# Patient Record
Sex: Male | Born: 1985 | Race: White | Hispanic: No | Marital: Single | Smoking: Former smoker
Health system: Southern US, Community
[De-identification: ages and names within clinical notes are randomized; demographics above are authoritative.]

## PROBLEM LIST (undated history)

## (undated) DIAGNOSIS — I444 Left anterior fascicular block: Secondary | ICD-10-CM

## (undated) DIAGNOSIS — G2581 Restless legs syndrome: Secondary | ICD-10-CM

## (undated) DIAGNOSIS — K219 Gastro-esophageal reflux disease without esophagitis: Secondary | ICD-10-CM

## (undated) DIAGNOSIS — R0602 Shortness of breath: Secondary | ICD-10-CM

## (undated) DIAGNOSIS — G4710 Hypersomnia, unspecified: Secondary | ICD-10-CM

## (undated) DIAGNOSIS — S36039A Unspecified laceration of spleen, initial encounter: Secondary | ICD-10-CM

## (undated) HISTORY — DX: Unspecified laceration of spleen, initial encounter: S36.039A

## (undated) HISTORY — DX: Left anterior fascicular block: I44.4

## (undated) HISTORY — PX: NO PAST SURGERIES: SHX2092

---
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
## 2004-09-23 ENCOUNTER — Emergency Department (HOSPITAL_COMMUNITY): Admission: EM | Admit: 2004-09-23 | Discharge: 2004-09-23 | Payer: Self-pay | Admitting: Emergency Medicine

## 2005-06-01 ENCOUNTER — Inpatient Hospital Stay (HOSPITAL_COMMUNITY): Admission: EM | Admit: 2005-06-01 | Discharge: 2005-06-06 | Payer: Self-pay | Admitting: Emergency Medicine
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
## 2019-06-19 NOTE — Progress Notes (Signed)
Cardiology Office Note:    Date:  06/23/2019   ID:  Dustin FredricksonJustin Bradley, DOB 1985-11-27, MRN [id number]  PCP:  [facility]  Cardiologist:  Norman HerrlichBrian Fidelia Cathers, MD   Referring MD: No ref. provider found  ASSESSMENT:    1. LAFB (left anterior fascicular block)   2. Varicose veins of leg with swelling, bilateral   3. Pain of lower extremity, unspecified laterality    PLAN:    In order of problems listed above:  1. I reviewed with him his EKG finding which is somewhat unusual in his age group but not specific and is not at risk of pacemaker therapy in the intermediate future.  I did ask him undergo an echocardiogram to screen for cardiomyopathy and structural heart disease. 2. He will have a lower extremity venous duplex done with complaints of pain and swelling and if he has evidence of venous insufficiency would benefit from lower extremity compressive hose.  Next appointment in 6 weeks to follow-up from this visit   Medication Adjustments/Labs and Tests Ordered: Current medicines are reviewed at length with the patient today.  Concerns regarding medicines are outlined above.  Orders Placed This Encounter  Procedures  . EKG 12-Lead  . VAS US LOWER EXTREMITY VENOUS (DVT)   No orders of the defined types were placed in this encounter.    Chief Complaint  Patient presents with  . Abnormal ECG  My legs swell  History of Present Illness:    Dustin FredricksonJustin Brannigan is a 33 y.o. male who is being seen today for the evaluation of an abnormal EKG [facility].  Initially there is discussion of stimulant therapy for ADHD EKG was performed and showed a pattern of incomplete right bundle branch block left anterior hemiblock QRS duration 114 ms 04/30/2019.  He has advised cardiology consultation and there was delay being seen in their organization.  EKG in my office today shows left anterior hemiblock QRS duration 104 ms.  He has no known history of heart disease or  systemic illness.  His risk factors are noteworthy for previous smoking.  In the interim he did heavy outdoor work and is complained of pain in the lower extremities especially in his calves and ankles has had mild dependent edema about the ankle and dorsum of the feet.  He is quite concerned about this.  He is not short of breath no orthopnea palpitation or syncope.  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  Has no known history of congenital or rheumatic heart disease.  Unfortunately is adding salt to his diet and asked him to stop with his dependent edema  Past Medical History:  Diagnosis Date  . GERD (gastroesophageal reflux disease) 06/23/2019  . Hypersomnia 06/23/2019  . Restless leg syndrome 06/23/2019  . Shortness of breath 06/23/2019  . Splenic laceration     Past Surgical History:  Procedure Laterality Date  . NO PAST SURGERIES      Current Medications: Current Meds  Medication Sig  . cetirizine (ZYRTEC) 10 MG tablet Take 10 mg by mouth daily.  Marland Kitchen. ibuprofen (ADVIL) 200 MG tablet Take 200 mg by mouth every 6 (six) hours as needed.  . Multiple Vitamin (MULTIVITAMIN) tablet Take 1 tablet by mouth daily.  Marland Kitchen. omeprazole (PRILOSEC) 20 MG capsule Take 20 mg by mouth daily.     Allergies:   Patient has no known allergies.  Social History   Socioeconomic History  . Marital status: Single    Spouse name: Not on file  . Number of children: Not on file  . Years of education: Not on file  . Highest education level: Not on file  Occupational History  . Not on file  Social Needs  . Financial resource strain: Not on file  . Food insecurity    Worry: Not on file    Inability: Not on file  . Transportation needs    Medical: Not on file    Non-medical: Not on file  Tobacco Use  . Smoking status: Former Smoker    Packs/day: 1.00    Years: 13.00    Pack years: 13.00    Types:  Cigarettes    Quit date: 2020    Years since quitting: 0.8  . Smokeless tobacco: Former Network engineer and Sexual Activity  . Alcohol use: Not Currently  . Drug use: Never  . Sexual activity: Not on file  Lifestyle  . Physical activity    Days per week: Not on file    Minutes per session: Not on file  . Stress: Not on file  Relationships  . Social Herbalist on phone: Not on file    Gets together: Not on file    Attends religious service: Not on file    Active member of club or organization: Not on file    Attends meetings of clubs or organizations: Not on file    Relationship status: Not on file  Other Topics Concern  . Not on file  Social History Narrative  . Not on file     Family History: The patient's family history includes Arrhythmia in his paternal grandfather; Diabetes Mellitus II in his father and mother; Heart attack in his maternal grandmother; Hyperlipidemia in his father and mother; Hypertension in his father and mother.  ROS:   Review of Systems  Constitution: Positive for weight loss.  HENT: Negative.   Eyes: Negative.  Visual disturbance: 19 lbs.  Cardiovascular: Positive for chest pain.  Respiratory: Negative.   Endocrine: Negative.   Hematologic/Lymphatic: Negative.   Skin: Negative.   Musculoskeletal: Positive for back pain, joint pain and myalgias.  Gastrointestinal: Negative.   Genitourinary: Negative.   Neurological: Positive for difficulty with concentration.  Psychiatric/Behavioral: Negative.   Allergic/Immunologic: Negative.    Please see the history of present illness.     All other systems reviewed and are negative.  EKGs/Labs/Other Studies Reviewed:    The following studies were reviewed today:   EKG:  EKG is  ordered today.  The ekg ordered today is personally reviewed and demonstrates sinus rhythm left anterior hemiblock QRS duration 104 ms  Recent Labs: No results found for requested labs within last 8760 hours.   Recent Lipid Panel No results found for: CHOL, TRIG, HDL, CHOLHDL, VLDL, LDLCALC, LDLDIRECT  Physical Exam:    VS:  BP 112/80 (BP Location: Right Arm, Patient Position: Sitting, Cuff Size: Large)   Pulse 78   Ht 6' (1.829 m)   Wt 212 lb 6.4 oz (96.3 kg)   SpO2 98%   BMI 28.81 kg/m     Wt Readings from Last 3 Encounters:  06/23/19 212 lb 6.4 oz (96.3 kg)     GEN:  Well nourished, well developed in no acute distress HEENT: Normal NECK: No JVD; No carotid bruits LYMPHATICS: No lymphadenopathy CARDIAC: RRR, no murmurs, rubs, gallops RESPIRATORY:  Clear to auscultation without rales, wheezing  or rhonchi  ABDOMEN: Soft, non-tender, non-distended MUSCULOSKELETAL: He has a minimal degree of dependent edema about the ankles he has no varicosities no palpable cord in the calves or lower extremities to suggest DVT edema; No deformity  SKIN: Warm and dry NEUROLOGIC:  Alert and oriented x 3 PSYCHIATRIC:  Normal affect     Signed, Norman Herrlich, MD  06/23/2019 9:48 AM    [facility]

## 2019-06-23 ENCOUNTER — Other Ambulatory Visit: Payer: Self-pay

## 2019-06-23 ENCOUNTER — Ambulatory Visit (INDEPENDENT_AMBULATORY_CARE_PROVIDER_SITE_OTHER): Payer: BC Managed Care – PPO | Admitting: Cardiology

## 2019-06-23 VITALS — BP 112/80 | HR 78 | Ht 72.0 in | Wt 212.4 lb

## 2019-06-23 DIAGNOSIS — K219 Gastro-esophageal reflux disease without esophagitis: Secondary | ICD-10-CM | POA: Insufficient documentation

## 2019-06-23 DIAGNOSIS — M79606 Pain in leg, unspecified: Secondary | ICD-10-CM | POA: Diagnosis not present

## 2019-06-23 DIAGNOSIS — I444 Left anterior fascicular block: Secondary | ICD-10-CM | POA: Diagnosis not present

## 2019-06-23 DIAGNOSIS — G4710 Hypersomnia, unspecified: Secondary | ICD-10-CM

## 2019-06-23 DIAGNOSIS — R0602 Shortness of breath: Secondary | ICD-10-CM | POA: Insufficient documentation

## 2019-06-23 DIAGNOSIS — I83893 Varicose veins of bilateral lower extremities with other complications: Secondary | ICD-10-CM | POA: Diagnosis not present

## 2019-06-23 DIAGNOSIS — G2581 Restless legs syndrome: Secondary | ICD-10-CM

## 2019-06-23 HISTORY — DX: Restless legs syndrome: G25.81

## 2019-06-23 HISTORY — DX: Hypersomnia, unspecified: G47.10

## 2019-06-23 HISTORY — DX: Gastro-esophageal reflux disease without esophagitis: K21.9

## 2019-06-23 HISTORY — DX: Shortness of breath: R06.02

## 2019-06-23 NOTE — Patient Instructions (Signed)
Medication Instructions:  Your physician recommends that you continue on your current medications as directed. Please refer to the Current Medication list given to you today.  *If you need a refill on your cardiac medications before your next appointment, please call your pharmacy*  Lab Work: None  If you have labs (blood work) drawn today and your tests are completely normal, you will receive your results only by: Marland Kitchen MyChart Message (if you have MyChart) OR . A paper copy in the mail If you have any lab test that is abnormal or we need to change your treatment, we will call you to review the results.  Testing/Procedures: You had an EKG today.   Your physician has requested that you have a lower extremity venous duplex. This test is an ultrasound of the veins in the legs. It looks at venous blood flow that carries blood from the heart to the legs. Allow one hour for a Lower Venous exam. There are no restrictions or special instructions.   Follow-Up: At [facility], you and your health needs are our priority.  As part of our continuing mission to provide you with exceptional heart care, we have created designated Provider Care Teams.  These Care Teams include your primary Cardiologist (physician) and Advanced Practice Providers (APPs -  Physician Assistants and Nurse Practitioners) who all work together to provide you with the care you need, when you need it.  Your next appointment:   6 weeks  The format for your next appointment:   In Person  Provider:   Shirlee More, MD

## 2019-06-24 ENCOUNTER — Ambulatory Visit: Payer: Self-pay

## 2019-06-24 ENCOUNTER — Encounter: Payer: Self-pay | Admitting: Orthopaedic Surgery

## 2019-06-24 ENCOUNTER — Ambulatory Visit (INDEPENDENT_AMBULATORY_CARE_PROVIDER_SITE_OTHER): Payer: BC Managed Care – PPO | Admitting: Orthopaedic Surgery

## 2019-06-24 DIAGNOSIS — M7662 Achilles tendinitis, left leg: Secondary | ICD-10-CM | POA: Diagnosis not present

## 2019-06-24 DIAGNOSIS — M7661 Achilles tendinitis, right leg: Secondary | ICD-10-CM

## 2019-06-24 MED ORDER — PREDNISONE 10 MG (21) PO TBPK: ORAL_TABLET | ORAL | 0 refills | Status: DC

## 2019-06-24 MED ORDER — MELOXICAM 7.5 MG PO TABS: ORAL_TABLET | ORAL | 2 refills | Status: DC

## 2019-06-24 NOTE — Progress Notes (Signed)
Office Visit Note   Patient: Dustin Bradley           Date of Birth: [birth year]           MRN: [id number] Visit Date: 06/24/2019              Requested by: No referring provider defined for this encounter. PCP: [facility]   Assessment & Plan: Visit Diagnoses:  1. Achilles tendinitis of both lower extremities     Plan: Impression is bilateral Achilles tendinitis.  We will start the patient on a course of steroids followed by anti-inflammatories as needed.  We will also go ahead and start him in physical therapy for range of motion and modalities.  He will follow-up with [place] as needed.  Call with concerns or questions.  Follow-Up Instructions: Return if symptoms worsen or fail to improve.   Orders:  Orders Placed This Encounter  Procedures  . XR Ankle Complete Right  . XR Ankle Complete Left  . Ambulatory referral to Physical Therapy   No orders of the defined types were placed in this encounter.     Procedures: No procedures performed   Clinical Data: No additional findings.   Subjective: Chief Complaint  Patient presents with  . Right Ankle - Pain  . Left Ankle - Pain    HPI patient is a pleasant 33 year old gentleman who presents to our clinic today with bilateral ankle pain right greater than left.  This started little over a month ago when he initially installed an irrigation system for his yard.  His symptoms worsened soon after when he aerated and seated the entire yard.  This was about a month ago.  His pain has worsened.  The pain he has is to the heel and radiates up the back of his leg.  Pain is worse when he is walking or driving.  He has been taking Advil with moderate relief of symptoms.  He does occasionally note burning into the feet.  No previous injury to the ankles.  Review of Systems as detailed in HPI.  All others reviewed and are negative.   Objective: Vital Signs: There were no vitals taken for this visit.  Physical Exam well  developed and well-nourished gentleman in no acute distress.  Alert and oriented x3. Ortho Exam examination of both ankles reveals marked tenderness along the Achilles tendon insertion at the heel.  No tenderness to the pre-Achilles bursa.  He has limited dorsiflexion and plantar flexion.  Calves are soft and nontender.  He is neurovascularly intact distally.  He is neurovascularly intact distally.  Specialty Comments:  No specialty comments available.  Imaging: Xr Ankle Complete Left  Result Date: 06/24/2019 No acute or structural abnormalities  Xr Ankle Complete Right  Result Date: 06/24/2019 No acute or structural abnormalities    PMFS History: Patient Active Problem List   Diagnosis Date Noted  . Shortness of breath 06/23/2019  . Restless leg syndrome 06/23/2019  . GERD (gastroesophageal reflux disease) 06/23/2019  . Hypersomnia 06/23/2019  . LAFB (left anterior fascicular block) 06/23/2019   Past Medical History:  Diagnosis Date  . GERD (gastroesophageal reflux disease) 06/23/2019  . Hypersomnia 06/23/2019  . Restless leg syndrome 06/23/2019  . Shortness of breath 06/23/2019  . Splenic laceration     Family History  Problem Relation Age of Onset  . Hypertension Mother   . Hyperlipidemia Mother   . Diabetes Mellitus II Mother   . Hypertension Father   . Hyperlipidemia Father   .  Diabetes Mellitus II Father   . Heart attack Maternal Grandmother   . Arrhythmia Paternal Grandfather     Past Surgical History:  Procedure Laterality Date  . NO PAST SURGERIES     Social History   Occupational History  . Not on file  Tobacco Use  . Smoking status: Former Smoker    Packs/day: 1.00    Years: 13.00    Pack years: 13.00    Types: Cigarettes    Quit date: 2020    Years since quitting: 0.8  . Smokeless tobacco: Former Network engineer and Sexual Activity  . Alcohol use: Not Currently  . Drug use: Never  . Sexual activity: Not on file

## 2019-06-26 ENCOUNTER — Other Ambulatory Visit: Payer: Self-pay

## 2019-06-26 ENCOUNTER — Ambulatory Visit (HOSPITAL_BASED_OUTPATIENT_CLINIC_OR_DEPARTMENT_OTHER)
Admission: RE | Admit: 2019-06-26 | Discharge: 2019-06-26 | Disposition: A | Discharge status: Home / Self Care | Payer: BC Managed Care – PPO | Source: Ambulatory Visit | Attending: Cardiology | Admitting: Cardiology

## 2019-06-26 DIAGNOSIS — M79606 Pain in leg, unspecified: Secondary | ICD-10-CM | POA: Diagnosis present

## 2019-06-26 DIAGNOSIS — I83893 Varicose veins of bilateral lower extremities with other complications: Secondary | ICD-10-CM | POA: Diagnosis present

## 2019-06-26 NOTE — Progress Notes (Signed)
  Echocardiogram 2D Echocardiogram has been performed.  Dustin Bradley 06/26/2019, 3:52 PM

## 2019-08-04 NOTE — Progress Notes (Signed)
Office Visit    Patient Name: Dustin Bradley Date of Encounter: 08/05/2019  Primary Care Provider:  Alan Ripper, [place] Primary Cardiologist:  Shirlee More, MD Electrophysiologist:  None   Chief Complaint    Dustin Bradley is a 33 y.o. male with a hx of LAFB, LE edema, allergic rhinitis, GERD, achilles tendinitis presents today for follow up of LE edema.   Past Medical History    Past Medical History:  Diagnosis Date  . GERD (gastroesophageal reflux disease) 06/23/2019  . Hypersomnia 06/23/2019  . Restless leg syndrome 06/23/2019  . Shortness of breath 06/23/2019  . Splenic laceration    Past Surgical History:  Procedure Laterality Date  . NO PAST SURGERIES      Allergies  No Known Allergies  History of Present Illness    Dustin Bradley is a 33 y.o. male with a hx of LAFB, LE edema, allergic rhinitis, GERD, achilles tendinitis last seen 06/23/19 by Dr. Bettina Gavia.  Referred initially by PCP at [facility] for abnormal EKG. EKG 04/30/19 showed incomplete RBBB with left anterior hemiblock, QRS 114 ms. EKG 06/23/19 at office visit with Dr. Bettina Gavia showed left anterior hemiblock, QRS 104.   At that office visit noted pain and mild dependent edema in bilateral calves and dorsum of feet. Works doing Scientist, research (physical sciences). He was recommended LE duplex and echocardiogram. Additionally, there was discussion regarding ADHD and stimulant therapy.  Risk factors include previous smoking.   Tells me the swelling in his leg has much improved. He was seen by orthopedics and started on Prenisone taper and Meloxicam and recommended to f/u with his PCP. Tells me he did not tolerate Meloxicam and his PCP switched to Naproxen. After completion of 10mg  Prednisone taper his PCP started him on Prednisone 5mg  daily. He has not taken the past 2 days as he developed a rash and there is concern he is having reaction to Prednisone. Has f/u with his PCP Wednesday and was started on  "cream and antibiotics" that he cannot recall name of by urgent care. He has been working with PT and reports improvement in his leg pain.   Reports his breathing is "not good". Gets dyspneic with exertion. No orthopnea, no SOB at rest. Seems like it is getting worse since stopping smoking in May. Not associated with chest pain. Tells me his breathing test were "not normal" that he had done within the past couple months. Sounds like spirometry testing from his description but do not have records unfortunately. Tells me he has not followed up with that provider. Does have history of asthma and has Albuterol PRN inhaler which he does not use.   Had an echocardiogram of his heart at [facility] and was told it was fine a couple of weeks ago. Will request report.   Family history is notable for coronary disease. Mom and dad with HTN, HLD, DM2. Maternal grandpa had a cardiac bypass and stents. His maternal grandmother had a pacemaker. His grandpa on his dad side has been recommended a pacemaker.   EKGs/Labs/Other Studies Reviewed:   The following studies were reviewed today:  Lower extremity duplex 06/26/19 Summary: Right: There is no evidence of deep vein thrombosis in the lower extremity.There is no evidence of superficial venous thrombosis. No cystic structure found in the popliteal fossa. Left: There is no evidence of deep vein thrombosis in the lower extremity.There is no evidence of superficial venous thrombosis. No cystic structure found in the popliteal fossa.  EKG:  No EKG  today.  Recent Labs: No results found for requested labs within last 8760 hours.  Recent Lipid Panel No results found for: CHOL, TRIG, HDL, CHOLHDL, VLDL, LDLCALC, LDLDIRECT  Home Medications   Current Meds  Medication Sig  . cetirizine (ZYRTEC) 10 MG tablet Take 10 mg by mouth daily.  . naproxen (NAPROSYN) 500 MG tablet Take 500 mg by mouth daily.  Marland Kitchen omeprazole (PRILOSEC) 20 MG capsule Take 20 mg by mouth daily.     Review of Systems    Review of Systems  Constitution: Negative for chills and malaise/fatigue.  Cardiovascular: Positive for dyspnea on exertion. Negative for chest pain, leg swelling, near-syncope, orthopnea and palpitations.  Respiratory: Negative for cough, sleep disturbances due to breathing and wheezing.   Skin: Positive for rash.  Gastrointestinal: Negative for nausea and vomiting.  Neurological: Negative for dizziness, light-headedness and weakness.   All other systems reviewed and are otherwise negative except as noted above.  Physical Exam    VS:  BP 118/80 (BP Location: Left Arm, Patient Position: Sitting, Cuff Size: Normal)   Pulse 83   Resp 18   Ht 6' (1.829 m)   Wt 219 lb (99.3 kg)   SpO2 98%   BMI 29.70 kg/m  , BMI Body mass index is 29.7 kg/m. GEN: Well nourished, well developed, in no acute distress. HEENT: normal. Neck: Supple, no JVD, carotid bruits, or masses. Cardiac: RRR, no murmurs, rubs, or gallops. No clubbing, cyanosis, edema.  Radials/DP/PT 2+ and equal bilaterally.  Respiratory:  Respirations regular and unlabored, clear to auscultation bilaterally. GI: Soft, nontender, nondistended, BS + x 4. MS: No deformity or atrophy. Skin: Warm and dry, no rash. Neuro:  Strength and sensation are intact. Psych: Normal affect.  Assessment & Plan    1. LAFB - Noted on EKG. No dizziness, lightheadedness, near-syncope. He will report these symptoms. Due to multiple family members with PPM or recommended for PPM, will plan to see annually for EKG.  2. Lower extremity edema - Resolved. 06/26/19 LE duplex negative for DVT. Ortho dx with achilles tendinitis 06/24/19. He has been on Prednisone and Naproxen. Echo done at [facility] reportedly normal per patient, will request records. Likely venous insufficiency and result of tendinitis. Recommend low sodium diet and elevation of lower extremities. No indication for further cardiac eval.   3. Pain of lower extremities -  Resolved after working with PT and addition of NSAID and Prednisone to his medication regimen.  4. Dyspnea on exertion - Onset approximately 6 months ago. "Get short of breath more easily than I should". Not associated with pain. Feels it is getting worse. Per his report had abnormal PFT within the past few months but has not followed up with that provider. Hx smoking, asthma. Has albuterol inhaler, but has not used - encouraged to use. Recommend discuss maintenance inhaler with his PCP. As echo reportedly normal would not pursue further cardiac evaluation. Likely etiology deconditioning vs asthma vs effects of smoking.  5. Cardiovascular risk factor - Family history of CAD. Hx of smoking. PCP follows his lipids closely per his report. Recommend annual lipid screening and risk reduction through low sodium heart healthy diet and regular cardiovascular exercise.   6. GERD - Well controlled on Omeprazole, continue. Discussed risk of NSAID therapy of ulcer and recommendation to continue PPI.   Disposition: Follow up in 1 year(s) with Dr. Dulce Sellar or Gillian Shields, NP.   Alver Sorrow, NP 08/05/2019, 9:36 AM

## 2019-08-05 ENCOUNTER — Ambulatory Visit (INDEPENDENT_AMBULATORY_CARE_PROVIDER_SITE_OTHER): Payer: BC Managed Care – PPO | Admitting: Family

## 2019-08-05 ENCOUNTER — Other Ambulatory Visit: Payer: Self-pay

## 2019-08-05 ENCOUNTER — Encounter: Payer: Self-pay | Admitting: Family

## 2019-08-05 VITALS — BP 118/80 | HR 83 | Resp 18 | Ht 72.0 in | Wt 219.0 lb

## 2019-08-05 DIAGNOSIS — I444 Left anterior fascicular block: Secondary | ICD-10-CM | POA: Diagnosis not present

## 2019-08-05 DIAGNOSIS — R600 Localized edema: Secondary | ICD-10-CM

## 2019-08-05 DIAGNOSIS — R0600 Dyspnea, unspecified: Secondary | ICD-10-CM | POA: Diagnosis not present

## 2019-08-05 DIAGNOSIS — Z9189 Other specified personal risk factors, not elsewhere classified: Secondary | ICD-10-CM

## 2019-08-05 DIAGNOSIS — M79606 Pain in leg, unspecified: Secondary | ICD-10-CM | POA: Diagnosis not present

## 2019-08-05 DIAGNOSIS — R0609 Other forms of dyspnea: Secondary | ICD-10-CM

## 2019-08-05 NOTE — Patient Instructions (Signed)
Medication Instructions:  No medication changes today.  *If you need a refill on your cardiac medications before your next appointment, please call your pharmacy*  Lab Work: None today.  If you have labs (blood work) drawn today and your tests are completely normal, you will receive your results only by: Marland Kitchen MyChart Message (if you have MyChart) OR . A paper copy in the mail If you have any lab test that is abnormal or we need to change your treatment, we will call you to review the results.  Testing/Procedures: None today.  Follow-Up: At [facility], you and your health needs are our priority.  As part of our continuing mission to provide you with exceptional heart care, we have created designated Provider Care Teams.  These Care Teams include your primary Cardiologist (physician) and Advanced Practice Providers (APPs -  Physician Assistants and Nurse Practitioners) who all work together to provide you with the care you need, when you need it.  Your next appointment:   1 year(s)  The format for your next appointment:   In Person  Provider:   You may see Shirlee More, MD or the following Advanced Practice Provider on your designated Care Team:    Laurann Montana, FNP  Other Instructions  Your EKG shows 'left anterior fascicular block' which is a slowing of one of the three electrical pathways in the heart. This is not concerning. We would like to keep an eye on it with an EKG once per year. If you notice new lightheadedness, dizziness, near-syncope, or syncope please report these symptoms.  May be worth discussing a maintenance inhaler with your primary care provider.
